# Patient Record
Sex: Female | Born: 1990 | Race: White | Hispanic: No | Marital: Single | Smoking: Never smoker
Health system: Southern US, Community
[De-identification: ages and names within clinical notes are randomized; demographics above are authoritative.]

## PROBLEM LIST (undated history)

## (undated) DIAGNOSIS — F32A Depression, unspecified: Secondary | ICD-10-CM

## (undated) DIAGNOSIS — F329 Major depressive disorder, single episode, unspecified: Secondary | ICD-10-CM

## (undated) DIAGNOSIS — F419 Anxiety disorder, unspecified: Secondary | ICD-10-CM

---

## 2014-07-25 ENCOUNTER — Ambulatory Visit: Payer: Self-pay | Admitting: Physician Assistant

## 2015-06-17 ENCOUNTER — Emergency Department
Admission: EM | Admit: 2015-06-17 | Discharge: 2015-06-17 | Disposition: A | Discharge status: Home / Self Care | Payer: Managed Care, Other (non HMO) | Attending: Emergency Medicine | Admitting: Emergency Medicine

## 2015-06-17 DIAGNOSIS — B0052 Herpesviral keratitis: Secondary | ICD-10-CM

## 2015-06-17 DIAGNOSIS — H5713 Ocular pain, bilateral: Secondary | ICD-10-CM | POA: Diagnosis present

## 2015-06-17 MED ORDER — FLUORESCEIN SODIUM 1 MG OP STRP
ORAL_STRIP | OPHTHALMIC | Status: AC
  Administered 2015-06-17: 2 via OPHTHALMIC
  Filled 2015-06-17: qty 1

## 2015-06-17 MED ORDER — TETRACAINE HCL 0.5 % OP SOLN
2.0000 [drp] | Freq: Once | OPHTHALMIC | Status: AC
  Administered 2015-06-17: 2 [drp] via OPHTHALMIC
  Filled 2015-06-17: qty 2

## 2015-06-17 MED ORDER — CIPROFLOXACIN HCL 0.3 % OP SOLN: 1.0000 [drp] | OPHTHALMIC | Status: AC

## 2015-06-17 MED ORDER — FLUORESCEIN SODIUM 1 MG OP STRP
2.0000 | ORAL_STRIP | Freq: Once | OPHTHALMIC | Status: AC
  Administered 2015-06-17: 2 via OPHTHALMIC

## 2015-06-17 NOTE — ED Notes (Addendum)
Pt reports since yesterday she has had redness to eyes and today with watery eyes and blurry vision and pain to both eyes

## 2015-06-17 NOTE — Discharge Instructions (Signed)

## 2015-06-17 NOTE — ED Provider Notes (Signed)
[facility] Emergency Department Provider Note  ____________________________________________  Time seen: Approximately 7:41 PM  I have reviewed the triage vital signs and the nursing notes.   HISTORY  Chief Complaint Eye Pain    HPI Caitlyn Grant is a 24 y.o. female resistance to the emergency department complaining of bilateral eye pain. She states that intact where yesterday it felt like "something was in my eye". She states that she removed her contacts yesterday afternoon with no residual pieces left in her eye. She states that the symptoms have continued and increased over the intervening time. She states that it is a burning sensation almost "like something is in my eyes". She denies photophobia. She states that her eyes have been excessively tearing over the last 24 hours. She has no history of same in the past. She's been wearing her glasses today versus her contacts. She denies any drainage from her eyes.   No past medical history on file.  There are no active problems to display for this patient.   No past surgical history on file.  Current Outpatient Rx  Name  Route  Sig  Dispense  Refill  . ciprofloxacin (CILOXAN) 0.3 % ophthalmic solution   Both Eyes   Place 1 drop into both eyes every 2 (two) hours. Administer 1 drop, every 2 hours, while awake, for 2 days. Then 1 drop, every 4 hours, while awake, for the next 5 days.   10 mL   0     Allergies Review of patient's allergies indicates no known allergies.  No family history on file.  Social History Social History  Substance Use Topics  . Smoking status: Not on file  . Smokeless tobacco: Not on file  . Alcohol Use: Not on file    Review of Systems Constitutional: No fever/chills Eyes: No visual changes. ENT: No sore throat. Cardiovascular: Denies chest pain. Respiratory: Denies shortness of breath. Gastrointestinal: No abdominal pain.  No nausea, no vomiting.  No  diarrhea.  No constipation. Genitourinary: Negative for dysuria. Musculoskeletal: Negative for back pain. Skin: Negative for rash. Neurological: Negative for headaches, focal weakness or numbness.  10-point ROS otherwise negative.  ____________________________________________   PHYSICAL EXAM:  VITAL SIGNS: ED Triage Vitals  Enc Vitals Group     BP 06/17/15 1902 143/90 mmHg     Pulse Rate 06/17/15 1902 72     Resp 06/17/15 1902 18     Temp 06/17/15 1902 98.2 F (36.8 C)     Temp Source 06/17/15 1902 Oral     SpO2 06/17/15 1902 98 %     Weight 06/17/15 1902 150 lb (68.04 kg)     Height 06/17/15 1902  (1.702 m)     Head Cir --      Peak Flow --      Pain Score 06/17/15 1905 9     Pain Loc --      Pain Edu? --      Excl. in GC? --     Constitutional: Alert and oriented. Well appearing and in no acute distress. Eyes: Conjunctivae are normal. PERRL. EOMI. endoscopic exam reveals red reflex, no visible foreign bodies, no AV nicking and optic nerve with no abnormality. Exam with fluorescein reveals dendritic ulcers to the 7:00 position in the right eye and 2:00 position in the left eye. No other abnormality appreciated during an inspection. Head: Atraumatic. Nose: No congestion/rhinnorhea. Mouth/Throat: Mucous membranes are moist.  Oropharynx non-erythematous. Neck: No stridor.   Cardiovascular:  Normal rate, regular rhythm. Grossly normal heart sounds.  Good peripheral circulation. Respiratory: Normal respiratory effort.  No retractions. Lungs CTAB. Gastrointestinal: Soft and nontender. No distention. No abdominal bruits. No CVA tenderness. Musculoskeletal: No lower extremity tenderness nor edema.  No joint effusions. Neurologic:  Normal speech and language. No gross focal neurologic deficits are appreciated. No gait instability. Skin:  Skin is warm, dry and intact. No rash noted. Psychiatric: Mood and affect are normal. Speech and behavior are  normal.  ____________________________________________   LABS (all labs ordered are listed, but only abnormal results are displayed)  Labs Reviewed - No data to display ____________________________________________  EKG   ____________________________________________  RADIOLOGY   ____________________________________________   PROCEDURES  Procedure(s) performed: flouroscein eye exam, see procedure note(s).   Patient's eyes were anesthetized using tetracaine 2 drops. She reports good improvement. 4 seen stain was applied bilaterally. Direct visualization through the woods lamp reveals injury ulcerations bilateral eyes. She has ulcerations to the 2:00 position in the left eye and 7:00 position in the right eye. No other abnormalities noted.  Critical Care performed: No  ____________________________________________   INITIAL IMPRESSION / ASSESSMENT AND PLAN / ED COURSE  Pertinent labs & imaging results that were available during my care of the patient were reviewed by me and considered in my medical decision making (see chart for details).  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 I informed patient of diagnosis. I'm prescribing Cipro for treatment. She verbalizes understanding of diagnosis and treatment plan. I advised the patient not to wear contacts for at least 2 days after the finish of antibiotics. Advised the patient to discard current contacts, and thoroughly cleanse contact holder. Patient verbalizes understanding of treatment plan and verbalizes compliance. Patient is to follow-up with ophthalmology should symptoms persist past treatment course. She is to return to the emergency department for any worsening of symptoms or visual acuity changes. ____________________________________________   FINAL CLINICAL IMPRESSION(S) / ED DIAGNOSES  Final diagnoses:  Dendritic ulcer      Racheal PatchesJonathan D Nikol Lemar,  PA-C 06/17/15 2145  Darien Ramusavid W Kaminski, MD 06/17/15 [phone]

## 2016-03-12 ENCOUNTER — Encounter: Payer: Self-pay | Admitting: *Deleted

## 2016-03-12 ENCOUNTER — Ambulatory Visit
Admission: EM | Admit: 2016-03-12 | Discharge: 2016-03-12 | Disposition: A | Discharge status: Home / Self Care | Payer: BLUE CROSS/BLUE SHIELD | Attending: Internal Medicine | Admitting: Internal Medicine

## 2016-03-12 DIAGNOSIS — L255 Unspecified contact dermatitis due to plants, except food: Secondary | ICD-10-CM | POA: Diagnosis not present

## 2016-03-12 HISTORY — DX: Major depressive disorder, single episode, unspecified: F32.9

## 2016-03-12 HISTORY — DX: Depression, unspecified: F32.A

## 2016-03-12 HISTORY — DX: Anxiety disorder, unspecified: F41.9

## 2016-03-12 MED ORDER — MUPIROCIN 2 % EX OINT: 1.0000 "application " | TOPICAL_OINTMENT | Freq: Three times a day (TID) | CUTANEOUS | Status: DC

## 2016-03-12 MED ORDER — TRIAMCINOLONE ACETONIDE 0.1 % EX CREA: 1.0000 "application " | TOPICAL_CREAM | Freq: Two times a day (BID) | CUTANEOUS | Status: DC

## 2016-03-12 MED ORDER — PREDNISONE 10 MG (21) PO TBPK: 10.0000 mg | ORAL_TABLET | Freq: Every day | ORAL | Status: DC

## 2016-03-12 NOTE — ED Notes (Signed)
Patient was possibly exposed to poison ivy via her inside outside cats 3 weeks ago. Rash is visible on arms and legs bilaterally.

## 2016-03-12 NOTE — Discharge Instructions (Signed)
Poison Oak °Poison oak is a rash caused by touching the leaves of the poison oak plant. You may have a rash with redness and itching. Sometimes, blisters appear and break open. Your eyes may get puffy (swollen). Poison oak often heals in 2 to 3 weeks without treatment.  °HOME CARE °· If you touch poison oak: °· Wash your skin with soap and water right away. Wash under your fingernails. Do not rub the skin very hard. °· Wash any clothes you were wearing. °· Avoid poison oak in the future. Poison oak usually has 3 leaves on a stem. °· Use medicines to help with itching as told by your doctor. Do not drive when you take this medicine. °· Keep open sores dry, clean, and covered with a bandage and medicated cream, if needed. °· Ask your doctor about medicine for children. °GET HELP RIGHT AWAY IF: °· You have open sores. °· Redness spreads beyond the area of the rash. °· There is yellowish white fluid (pus) coming from the rash. °· Pain gets worse. °· You have a temperature by mouth above 102° F (38.9° C), not controlled by medicine. °MAKE SURE YOU: °· Understand these instructions. °· Will watch your condition. °· Will get help right away if you are not doing well or get worse. °  °This information is not intended to replace advice given to you by your health care provider. Make sure you discuss any questions you have with your health care provider. °  °Document Released: 09/21/2010 Document Revised: 11/11/2011 Document Reviewed: 01/25/2015 °Elsevier Interactive Patient Education ©2016 [employer] ° °Contact Dermatitis °Dermatitis is redness, soreness, and swelling (inflammation) of the skin. Contact dermatitis is a reaction to certain substances that touch the skin. There are two types of contact dermatitis:  °· Irritant contact dermatitis. This type is caused by something that irritates your skin, such as dry hands from washing them too much. This type does not require previous exposure to the substance for a  reaction to occur. This type is more common. °· Allergic contact dermatitis. This type is caused by a substance that you are allergic to, such as a nickel allergy or poison ivy. This type only occurs if you have been exposed to the substance (allergen) before. Upon a repeat exposure, your body reacts to the substance. This type is less common. °CAUSES  °Many different substances can cause contact dermatitis. Irritant contact dermatitis is most commonly caused by exposure to:  °· Makeup.   °· Soaps.   °· Detergents.   °· Bleaches.   °· Acids.   °· Metal salts, such as nickel.   °Allergic contact dermatitis is most commonly caused by exposure to:  °· Poisonous plants.   °· Chemicals.   °· Jewelry.   °· Latex.   °· Medicines.   °· Preservatives in products, such as clothing.   °RISK FACTORS °This condition is more likely to develop in:  °· People who have jobs that expose them to irritants or allergens. °· People who have certain medical conditions, such as asthma or eczema.   °SYMPTOMS  °Symptoms of this condition may occur anywhere on your body where the irritant has touched you or is touched by you. Symptoms include: °· Dryness or flaking.   °· Redness.   °· Cracks.   °· Itching.   °· Pain or a burning feeling.   °· Blisters. °· Drainage of small amounts of blood or clear fluid from skin cracks. °With allergic contact dermatitis, there may also be swelling in areas such as the eyelids, mouth, or genitals.  °DIAGNOSIS  °This condition is   diagnosed with a medical history and physical exam. A patch skin test may be performed to help determine the cause. If the condition is related to your job, you may need to see an occupational medicine specialist. °TREATMENT °Treatment for this condition includes figuring out what caused the reaction and protecting your skin from further contact. Treatment may also include:  °· Steroid creams or ointments. Oral steroid medicines may be needed in more severe cases. °· Antibiotics or  antibacterial ointments, if a skin infection is present. °· Antihistamine lotion or an antihistamine taken by mouth to ease itching. °· A bandage (dressing). °HOME CARE INSTRUCTIONS °Skin Care  °· Moisturize your skin as needed.   °· Apply cool compresses to the affected areas. °· Try taking a bath with: °¨ Epsom salts. Follow the instructions on the packaging. You can get these at your local pharmacy or grocery store. °¨ Baking soda. Pour a small amount into the bath as directed by your health care provider. °¨ Colloidal oatmeal. Follow the instructions on the packaging. You can get this at your local pharmacy or grocery store. °· Try applying baking soda paste to your skin. Stir water into baking soda until it reaches a paste-like consistency. °· Do not scratch your skin. °· Bathe less frequently, such as every other day. °· Bathe in lukewarm water. Avoid using hot water. °Medicines  °· Take or apply over-the-counter and prescription medicines only as told by your health care provider.   °· If you were prescribed an antibiotic medicine, take or apply your antibiotic as told by your health care provider. Do not stop using the antibiotic even if your condition starts to improve. °General Instructions  °· Keep all follow-up visits as told by your health care provider. This is important. °· Avoid the substance that caused your reaction. If you do not know what caused it, keep a journal to try to track what caused it. Write down: °¨ What you eat. °¨ What cosmetic products you use. °¨ What you drink. °¨ What you wear in the affected area. This includes jewelry. °· If you were given a dressing, take care of it as told by your health care provider. This includes when to change and remove it. °SEEK MEDICAL CARE IF:  °· Your condition does not improve with treatment. °· Your condition gets worse. °· You have signs of infection such as swelling, tenderness, redness, soreness, or warmth in the affected area. °· You have a  fever. °· You have new symptoms. °SEEK IMMEDIATE MEDICAL CARE IF:  °· You have a severe headache, neck pain, or neck stiffness. °· You vomit. °· You feel very sleepy. °· You notice red streaks coming from the affected area. °· Your bone or joint underneath the affected area becomes painful after the skin has healed. °· The affected area turns darker. °· You have difficulty breathing. °  °This information is not intended to replace advice given to you by your health care provider. Make sure you discuss any questions you have with your health care provider. °  °Document Released: 08/16/2000 Document Revised: 05/10/2015 Document Reviewed: 01/04/2015 °Elsevier Interactive Patient Education ©2016 Elsevier Inc. ° °

## 2016-03-12 NOTE — ED Provider Notes (Signed)
CSN: [id number]     Arrival date & time 03/12/16  1220 History   First MD Initiated Contact with Patient 03/12/16 1412     Chief Complaint  Patient presents with  . Rash   (Consider location/radiation/quality/duration/timing/severity/associated sxs/prior Treatment) HPI   This a 25 year old female states that [place] and [place] 2 cats have been exploring the woods. Is that since that time she has noticed rash or arms and legs bilaterally. Is where she puts the cats and they were able to rub up against her. She has no rash on the closed areas. He says they are intensely itchy.   Past Medical History  Diagnosis Date  . Depression   . Anxiety    History reviewed. No pertinent past surgical history. Family History  Problem Relation Age of Onset  . Hypertension Mother   . Diabetes Father    Social History  Substance Use Topics  . Smoking status: Never Smoker   . Smokeless tobacco: Never Used  . Alcohol Use: Yes   OB History    No data available     Review of Systems  Constitutional: Positive for activity change. Negative for fever, chills and fatigue.  Skin: Positive for rash.  All other systems reviewed and are negative.   Allergies  Review of patient's allergies indicates no known allergies.  Home Medications   Prior to Admission medications   Medication Sig Start Date End Date Taking? Authorizing Provider  Venlafaxine HCl 150 MG TB24 Take 1 tablet by mouth daily.   Yes Historical Provider, MD  mupirocin ointment (BACTROBAN) 2 % Apply 1 application topically 3 (three) times daily. 03/12/16   Lutricia FeilWilliam P Cela Newcom, PA-C  predniSONE (STERAPRED UNI-PAK 21 TAB) 10 MG (21) TBPK tablet Take 1 tablet (10 mg total) by mouth daily. Take 6 tabs by mouth daily  for 2 days, then 5 tabs for 2 days, then 4 tabs for 2 days, then 3 tabs for 2 days, 2 tabs for 2 days, then 1 tab by mouth daily for 2 days 03/12/16   Lutricia FeilWilliam P Hattie Pine, PA-C  triamcinolone cream  (KENALOG) 0.1 % Apply 1 application topically 2 (two) times daily. 03/12/16   Lutricia FeilWilliam P Maleigha Colvard, PA-C   Meds Ordered and Administered this Visit  Medications - No data to display  BP 126/72 mmHg  Pulse 58  Temp(Src) 98 F (36.7 C) (Oral)  Resp 18  Ht 5\' 7"  (1.702 m)  Wt 160 lb (72.576 kg)  BMI 25.05 kg/m2  SpO2 100%  LMP 03/04/2016 No data found.   Physical Exam  Constitutional: She is oriented to person, place, and time. She appears well-developed and well-nourished. No distress.  HENT:  Head: Normocephalic and atraumatic.  Eyes: Conjunctivae are normal. Pupils are equal, round, and reactive to light.  Neck: Normal range of motion. Neck supple.  Musculoskeletal: Normal range of motion. She exhibits no edema or tenderness.  Neurological: She is alert and oriented to person, place, and time.  Skin: Skin is warm and dry. She is not diaphoretic.  Examination of the patient's arms and legs shows patches of a vesicular linear rash on an erythematous base. Primarily on her hands and arms and also on her legs on the exposed surfaces. The patient does have extensive scarring from a previous injury on her arms. And she has an abrasion where she fell down on her anterior knee that has appearance of early secondary infection.  Psychiatric: She has a normal mood and  affect. Her behavior is normal. Judgment and thought content normal.  Nursing note and vitals reviewed.   ED Course  Procedures (including critical care time)  Labs Review Labs Reviewed - No data to display  Imaging Review No results found.   Visual Acuity Review  Right Eye Distance:   Left Eye Distance:   Bilateral Distance:    Right Eye Near:   Left Eye Near:    Bilateral Near:         MDM   1. Contact dermatitis due to Genus Toxicodendron    Discharge Medication List as of 03/12/2016  2:28 PM    Plan: 1. Test/x-ray results and diagnosis reviewed with patient 2. rx as per orders; risks, benefits,  potential side effects reviewed with patient 3. Recommend supportive treatment with cool compresses or baths as necessary for comfort I recommended for the itching that she consider using Zyrtec or allegra  daytime and Benadryl at night. Cause the itching is so intense at this point I will also provide her with a small amount of steroid cream for topical control until the oral steroids start taking effect. I told her that if she contacts the cats that she should wash thoroughly with Dawn dishwashing soap. Will help dissipate the oils from the poison/poison ivy. Any further problems she should see her primary care physician or return to our clinic 4. F/u prn if symptoms worsen or don't improve      Lutricia Feil, PA-C 03/12/16 1439  Lutricia Feil, PA-C 03/12/16 1936

## 2016-09-16 ENCOUNTER — Ambulatory Visit
Admission: EM | Admit: 2016-09-16 | Discharge: 2016-09-16 | Disposition: A | Discharge status: Home / Self Care | Payer: BLUE CROSS/BLUE SHIELD | Attending: Family Medicine | Admitting: Family Medicine

## 2016-09-16 ENCOUNTER — Encounter: Payer: Self-pay | Admitting: Emergency Medicine

## 2016-09-16 DIAGNOSIS — H6501 Acute serous otitis media, right ear: Secondary | ICD-10-CM | POA: Diagnosis not present

## 2016-09-16 MED ORDER — AMOXICILLIN 875 MG PO TABS: 875.0000 mg | ORAL_TABLET | Freq: Two times a day (BID) | ORAL | 0 refills | Status: DC

## 2016-09-16 NOTE — ED Provider Notes (Signed)
[facility]    CSN: [id number] Arrival date & time: 09/16/16  0915     History   Chief Complaint Chief Complaint  Patient presents with  . Otalgia    right ear    HPI Caitlyn Grant is a 26 y.o. female.   The history is provided by the patient.  Otalgia  Associated symptoms: congestion and cough   URI  Presenting symptoms: congestion, cough and ear pain   Severity:  Moderate Onset quality:  Sudden Duration:  7 days Timing:  Constant Progression:  Worsening Chronicity:  New Relieved by:  Nothing Ineffective treatments:  OTC medications Associated symptoms: no wheezing   Risk factors: sick contacts   Risk factors: not elderly, no chronic cardiac disease, no chronic kidney disease, no chronic respiratory disease, no diabetes mellitus, no immunosuppression, no recent illness and no recent travel     Past Medical History:  Diagnosis Date  . Anxiety   . Depression     There are no active problems to display for this patient.   History reviewed. No pertinent surgical history.  OB History    No data available       Home Medications    Prior to Admission medications   Medication Sig Start Date End Date Taking? Authorizing Provider  amoxicillin (AMOXIL) 875 MG tablet Take 1 tablet (875 mg total) by mouth 2 (two) times daily. 09/16/16   Payton Mccallum, MD  Venlafaxine HCl 150 MG TB24 Take 1 tablet by mouth daily.    Historical Provider, MD    Family History Family History  Problem Relation Age of Onset  . Hypertension Mother   . Diabetes Father     Social History Social History  Substance Use Topics  . Smoking status: Never Smoker  . Smokeless tobacco: Never Used  . Alcohol use Yes     Allergies   Patient has no known allergies.   Review of Systems Review of Systems  HENT: Positive for congestion and ear pain.   Respiratory: Positive for cough. Negative for wheezing.      Physical Exam Triage Vital Signs ED Triage  Vitals  Enc Vitals Group     BP 09/16/16 0933 131/71     Pulse Rate 09/16/16 0933 92     Resp 09/16/16 0933 16     Temp 09/16/16 0933 99.1 F (37.3 C)     Temp Source 09/16/16 0933 Oral     SpO2 09/16/16 0933 100 %     Weight 09/16/16 0932 165 lb (74.8 kg)     Height 09/16/16 0932 5\' 7"  (1.702 m)     Head Circumference --      Peak Flow --      Pain Score 09/16/16 0933 7     Pain Loc --      Pain Edu? --      Excl. in GC? --    No data found.   Updated Vital Signs BP 131/71 (BP Location: Left Arm)   Pulse 92   Temp 99.1 F (37.3 C) (Oral)   Resp 16   Ht 5\' 7"  (1.702 m)   Wt 165 lb (74.8 kg)   LMP 09/06/2016 (Approximate)   SpO2 100%   BMI 25.84 kg/m   Visual Acuity Right Eye Distance:   Left Eye Distance:   Bilateral Distance:    Right Eye Near:   Left Eye Near:    Bilateral Near:     Physical Exam  Constitutional: She appears well-developed  and well-nourished. No distress.  HENT:  Head: Normocephalic and atraumatic.  Right Ear: External ear and ear canal normal. Tympanic membrane is erythematous and bulging. A middle ear effusion is present.  Left Ear: Tympanic membrane, external ear and ear canal normal.  Nose: Mucosal edema and rhinorrhea present. No nose lacerations, sinus tenderness, nasal deformity, septal deviation or nasal septal hematoma. No epistaxis.  No foreign bodies. Right sinus exhibits no maxillary sinus tenderness and no frontal sinus tenderness. Left sinus exhibits no maxillary sinus tenderness and no frontal sinus tenderness.  Mouth/Throat: Uvula is midline, oropharynx is clear and moist and mucous membranes are normal. No oropharyngeal exudate.  Eyes: Conjunctivae and EOM are normal. Pupils are equal, round, and reactive to light. Right eye exhibits no discharge. Left eye exhibits no discharge. No scleral icterus.  Neck: Normal range of motion. Neck supple. No thyromegaly present.  Cardiovascular: Normal rate, regular rhythm and normal heart  sounds.   Pulmonary/Chest: Effort normal and breath sounds normal. No respiratory distress. She has no wheezes. She has no rales.  Lymphadenopathy:    She has no cervical adenopathy.  Skin: She is not diaphoretic.  Nursing note and vitals reviewed.    UC Treatments / Results  Labs (all labs ordered are listed, but only abnormal results are displayed) Labs Reviewed - No data to display  EKG  EKG Interpretation None       Radiology No results found.  Procedures Procedures (including critical care time)  Medications Ordered in UC Medications - No data to display   Initial Impression / Assessment and Plan / UC Course  I have reviewed the triage vital signs and the nursing notes.  Pertinent labs & imaging results that were available during my care of the patient were reviewed by me and considered in my medical decision making (see chart for details).  Clinical Course       Final Clinical Impressions(s) / UC Diagnoses   Final diagnoses:  Right acute serous otitis media, recurrence not specified    New Prescriptions Discharge Medication List as of 09/16/2016 10:29 AM    START taking these medications   Details  amoxicillin (AMOXIL) 875 MG tablet Take 1 tablet (875 mg total) by mouth 2 (two) times daily., Starting Mon 09/16/2016, Normal       1. diagnosis reviewed with patient 2. rx as per orders above; reviewed possible side effects, interactions, risks and benefits  3. Follow-up prn if symptoms worsen or don't improve   Payton Mccallumrlando Newman Waren, MD 09/16/16 1049

## 2016-09-16 NOTE — ED Triage Notes (Signed)
Patient c/o right ear pain that started on Friday and that her left ear started to hurt this morning.

## 2016-12-11 ENCOUNTER — Other Ambulatory Visit: Payer: Self-pay | Admitting: Medical Oncology

## 2016-12-11 DIAGNOSIS — N6310 Unspecified lump in the right breast, unspecified quadrant: Secondary | ICD-10-CM

## 2016-12-13 ENCOUNTER — Ambulatory Visit: Payer: Self-pay | Admitting: Advanced Practice Midwife

## 2016-12-19 ENCOUNTER — Other Ambulatory Visit: Payer: BLUE CROSS/BLUE SHIELD

## 2016-12-20 ENCOUNTER — Ambulatory Visit: Payer: Self-pay | Admitting: Advanced Practice Midwife

## 2016-12-30 ENCOUNTER — Ambulatory Visit
Admission: RE | Admit: 2016-12-30 | Discharge: 2016-12-30 | Disposition: A | Discharge status: Home / Self Care | Payer: BLUE CROSS/BLUE SHIELD | Source: Ambulatory Visit | Attending: Medical Oncology | Admitting: Medical Oncology

## 2016-12-30 DIAGNOSIS — N6310 Unspecified lump in the right breast, unspecified quadrant: Secondary | ICD-10-CM

## 2016-12-30 DIAGNOSIS — N6313 Unspecified lump in the right breast, lower outer quadrant: Secondary | ICD-10-CM | POA: Diagnosis not present

## 2017-03-17 ENCOUNTER — Encounter: Payer: Self-pay | Admitting: Obstetrics & Gynecology

## 2017-03-17 ENCOUNTER — Ambulatory Visit (INDEPENDENT_AMBULATORY_CARE_PROVIDER_SITE_OTHER): Payer: BLUE CROSS/BLUE SHIELD | Admitting: Obstetrics & Gynecology

## 2017-03-17 DIAGNOSIS — T8332XA Displacement of intrauterine contraceptive device, initial encounter: Secondary | ICD-10-CM | POA: Insufficient documentation

## 2017-03-17 DIAGNOSIS — T8389XA Other specified complication of genitourinary prosthetic devices, implants and grafts, initial encounter: Secondary | ICD-10-CM | POA: Diagnosis not present

## 2017-03-17 NOTE — Progress Notes (Signed)
  History of Present Illness:  Caitlyn Grant is a [age] y.o. that had a Skyla IUD placed approximately 12 months ago. Since that time, she states that she has had feelingthat strings are longer and uncomfrtable and no pain or bleeding abnormalities; BF has no complaints  PMHx: She  has a past medical history of Anxiety and Depression. Also,  has no past surgical history on file., family history includes Diabetes in her father; Hypertension in her mother.,  reports that she has never smoked. She has never used smokeless tobacco. She reports that she drinks alcohol. She reports that she does not use drugs. Current Meds  Medication Sig  . Venlafaxine HCl 150 MG TB24 Take 1 tablet by mouth daily.  .  Also, has No Known Allergies..  Review of Systems  Constitutional: Negative for chills, fever and malaise/fatigue.  HENT: Negative for congestion, sinus pain and sore throat.   Eyes: Negative for blurred vision and pain.  Respiratory: Negative for cough and wheezing.   Cardiovascular: Negative for chest pain and leg swelling.  Gastrointestinal: Negative for abdominal pain, constipation, diarrhea, heartburn, nausea and vomiting.  Genitourinary: Negative for dysuria, frequency, hematuria and urgency.  Musculoskeletal: Negative for back pain, joint pain, myalgias and neck pain.  Skin: Negative for itching and rash.  Neurological: Negative for dizziness, tremors and weakness.  Endo/Heme/Allergies: Does not bruise/bleed easily.  Psychiatric/Behavioral: Negative for depression. The patient is not nervous/anxious and does not have insomnia.     Physical Exam:  BP 120/80   Pulse 74   Ht 5\' 7"  (1.702 m)   Wt 180 lb (81.6 kg)   LMP 03/12/2017   BMI 28.19 kg/m  Body mass index is 28.19 kg/m. Constitutional: Well nourished, well developed female in no acute distress.  Abdomen: diffusely non tender to palpation, non distended, and no masses, hernias Neuro: Grossly intact Psych:  Normal mood and  affect.    Pelvic exam:  Two IUD strings present seen coming from the cervical os. 1 cm. EGBUS, vaginal vault and cervix: within normal limits  Assessment: IUD strings present in proper location; possibly IUD shifted up a cm or less, no other cause for alarm at this time.  US if pain or bleeding occurs.  Plan: She was told to continue to use barrier contraception, in order to prevent any STIs, and to take a home pregnancy test or call us if she ever thinks she may be pregnant, and that her IUD expires in 2 years.  She was amenable to this plan and we will see her back for PAP  Annamarie MajorPaul Maris Bena, MD, Merlinda FrederickFACOG [facility], [facility] 03/17/2017  4:27 PM

## 2017-11-10 ENCOUNTER — Telehealth: Payer: Self-pay

## 2017-11-10 NOTE — Telephone Encounter (Signed)
Pt debating on appt.  Has pain during and after IC, constant pain/pressure in bladder area.  ?something wroing c bladder, feels like UTI but she is sure she doesn't have on.  [phone]  Left detailed msg for pt to be seen.

## 2017-11-14 ENCOUNTER — Ambulatory Visit (INDEPENDENT_AMBULATORY_CARE_PROVIDER_SITE_OTHER): Payer: BLUE CROSS/BLUE SHIELD | Admitting: Obstetrics and Gynecology

## 2017-11-14 ENCOUNTER — Encounter: Payer: Self-pay | Admitting: Obstetrics and Gynecology

## 2017-11-14 ENCOUNTER — Ambulatory Visit: Payer: BLUE CROSS/BLUE SHIELD | Admitting: Obstetrics and Gynecology

## 2017-11-14 ENCOUNTER — Other Ambulatory Visit: Payer: Self-pay

## 2017-11-14 VITALS — BP 120/80 | HR 80 | Ht 67.0 in | Wt 186.0 lb

## 2017-11-14 DIAGNOSIS — Z30431 Encounter for routine checking of intrauterine contraceptive device: Secondary | ICD-10-CM

## 2017-11-14 DIAGNOSIS — N941 Unspecified dyspareunia: Secondary | ICD-10-CM

## 2017-11-14 DIAGNOSIS — R300 Dysuria: Secondary | ICD-10-CM | POA: Diagnosis not present

## 2017-11-14 DIAGNOSIS — R102 Pelvic and perineal pain: Secondary | ICD-10-CM

## 2017-11-14 LAB — POCT URINALYSIS DIPSTICK
BILIRUBIN UA: NEGATIVE
Glucose, UA: NEGATIVE
KETONES UA: NEGATIVE
Leukocytes, UA: NEGATIVE
Nitrite, UA: NEGATIVE
PH UA: 6 (ref 5.0–8.0)
Protein, UA: NEGATIVE
RBC UA: NEGATIVE
SPEC GRAV UA: 1.01 (ref 1.010–1.025)

## 2017-11-14 NOTE — Patient Instructions (Signed)
I value your feedback and entrusting us with your care. If you get a [place] patient survey, I would appreciate you taking the time to let us know about your experience today. Thank you! 

## 2017-11-14 NOTE — Progress Notes (Signed)
Patient, No Pcp Per   Chief Complaint  Patient presents with  . Gynecologic Exam    painful intercourse & abdominal pain days after, dysuria    HPI:      Ms. Jerrye Seebeck is a 27 y.o. G0P0000 who LMP was Patient's last menstrual period was 10/29/2017., presents today for sharp pain during sex for the past month. She then has a dull ache for 1-2 days afterwards. She doesn't take any meds for pain. No bleeding with sex. No new sex partners. No vag sx, GI sx. She does note urin frequency, urgency and dysuria for a few days after sex that resolved. Hx of UTIs in past. No fevers, LBP. She also has mild cramping throughout the month.  Pt has Skyla placed 06/28/16. Menses are monthly, last 1-2 days with light flow, no BTB. She does have dysmen and treats with NSAIDs with relief.    Past Medical History:  Diagnosis Date  . Anxiety   . Depression     History reviewed. No pertinent surgical history.  Family History  Problem Relation Age of Onset  . Hypertension Mother   . Diabetes Father     Social History   Socioeconomic History  . Marital status: Single    Spouse name: Not on file  . Number of children: Not on file  . Years of education: Not on file  . Highest education level: Not on file  Social Needs  . Financial resource strain: Not on file  . Food insecurity - worry: Not on file  . Food insecurity - inability: Not on file  . Transportation needs - medical: Not on file  . Transportation needs - non-medical: Not on file  Occupational History  . Not on file  Tobacco Use  . Smoking status: Never Smoker  . Smokeless tobacco: Never Used  Substance and Sexual Activity  . Alcohol use: Yes  . Drug use: No  . Sexual activity: Yes    Birth control/protection: IUD  Other Topics Concern  . Not on file  Social History Narrative  . Not on file    Current Outpatient Medications on File Prior to Visit  Medication Sig Dispense Refill  . venlafaxine XR  (EFFEXOR-XR) 150 MG 24 hr capsule TAKE ONE CAPSULE BY MOUTH EVERY DAY    . amoxicillin (AMOXIL) 875 MG tablet Take 1 tablet (875 mg total) by mouth 2 (two) times daily. (Patient not taking: Reported on 03/17/2017) 20 tablet 0   No current facility-administered medications on file prior to visit.      ROS:  Review of Systems  Constitutional: Negative for fever.  Gastrointestinal: Negative for blood in stool, constipation, diarrhea, nausea and vomiting.  Genitourinary: Positive for dyspareunia, dysuria, frequency, pelvic pain and urgency. Negative for flank pain, hematuria, vaginal bleeding, vaginal discharge and vaginal pain.  Musculoskeletal: Negative for back pain.  Skin: Negative for rash.   BREAST: No symptoms   OBJECTIVE:   Vitals:  BP 120/80 (BP Location: Left Arm, Patient Position: Sitting, Cuff Size: Normal)   Pulse 80   Ht 5\' 7"  (1.702 m)   Wt 186 lb (84.4 kg)   LMP 10/29/2017   BMI 29.13 kg/m   Physical Exam  Constitutional: She is oriented to person, place, and time and well-developed, well-nourished, and in no distress. Vital signs are normal.  Abdominal: Soft. Normal appearance. There is no tenderness. There is no rebound and no guarding.  Genitourinary: Vagina normal, uterus normal, cervix normal, right adnexa normal,  left adnexa normal and vulva normal. Uterus is not enlarged. Cervix exhibits no motion tenderness and no tenderness. Right adnexum displays no mass and no tenderness. Left adnexum displays no mass and no tenderness. Vulva exhibits no erythema, no exudate, no lesion, no rash and no tenderness. Vagina exhibits no lesion.  Genitourinary Comments: IUD STRINGS IN PLACE  Neurological: She is oriented to person, place, and time.  Psychiatric: Memory, affect and judgment normal.  Vitals reviewed.  Assessment/Plan: Dyspareunia in female - Neg exam, IUD in place. Check GYN u/s--will call with results. Gon/chlam today. - Plan: US PELVIS TRANSVANGINAL NON-OB (TV  ONLY)  Pelvic pain - Plan: US PELVIS TRANSVANGINAL NON-OB (TV ONLY), Chlamydia/Gonococcus/Trichomonas, NAA  Encounter for routine checking of intrauterine contraceptive device (IUD) - Plan: US PELVIS TRANSVANGINAL NON-OB (TV ONLY)  Dysuria - Neg dip. Most likely urethritis after sex. Void before and after sex. F/u prn.  - Plan: POCT Urinalysis Dipstick    Return in 7 days (on 11/21/2017), or if symptoms worsen or fail to improve, for GYN u/s for IUD placement/pelvic pain--[facility] to call pt. (pt needs Fri appt, no appts available today)  Faryn Sieg B. Latunya Kissick, PA-C 11/14/2017 9:50 AM

## 2017-11-19 LAB — CHLAMYDIA/GONOCOCCUS/TRICHOMONAS, NAA
Chlamydia by NAA: NEGATIVE
Gonococcus by NAA: NEGATIVE
Trich vag by NAA: NEGATIVE

## 2017-11-21 ENCOUNTER — Other Ambulatory Visit: Payer: BLUE CROSS/BLUE SHIELD

## 2017-11-24 ENCOUNTER — Other Ambulatory Visit: Payer: BLUE CROSS/BLUE SHIELD

## 2017-11-25 ENCOUNTER — Ambulatory Visit (INDEPENDENT_AMBULATORY_CARE_PROVIDER_SITE_OTHER): Payer: BLUE CROSS/BLUE SHIELD

## 2017-11-25 DIAGNOSIS — N941 Unspecified dyspareunia: Secondary | ICD-10-CM | POA: Diagnosis not present

## 2017-11-25 DIAGNOSIS — Z30431 Encounter for routine checking of intrauterine contraceptive device: Secondary | ICD-10-CM

## 2017-11-25 DIAGNOSIS — R102 Pelvic and perineal pain: Secondary | ICD-10-CM

## 2017-11-27 ENCOUNTER — Telehealth: Payer: Self-pay | Admitting: Obstetrics and Gynecology

## 2017-11-27 NOTE — Telephone Encounter (Signed)
Pt aware of neg GYN u/s for pelvic pain/dyspareunia. IUD in correct location. Not as sex active currently so sx improved. Discussed IUD removal if sx persist to see if helps sx. No urin, GI sx. F/u prn.

## 2018-03-18 ENCOUNTER — Ambulatory Visit (INDEPENDENT_AMBULATORY_CARE_PROVIDER_SITE_OTHER): Payer: BLUE CROSS/BLUE SHIELD | Admitting: Obstetrics and Gynecology

## 2018-03-18 ENCOUNTER — Encounter: Payer: Self-pay | Admitting: Obstetrics and Gynecology

## 2018-03-18 VITALS — BP 124/62 | HR 96 | Ht 67.0 in | Wt 189.0 lb

## 2018-03-18 DIAGNOSIS — N898 Other specified noninflammatory disorders of vagina: Secondary | ICD-10-CM

## 2018-03-18 LAB — POCT WET PREP WITH KOH
CLUE CELLS WET PREP PER HPF POC: NEGATIVE
KOH Prep POC: NEGATIVE
TRICHOMONAS UA: NEGATIVE
Yeast Wet Prep HPF POC: NEGATIVE

## 2018-03-18 NOTE — Patient Instructions (Signed)
I value your feedback and entrusting us with your care. If you get a [place] patient survey, I would appreciate you taking the time to let us know about your experience today. Thank you! 

## 2018-03-18 NOTE — Progress Notes (Signed)
Patient, No Pcp Per   Chief Complaint  Patient presents with  . Vaginitis    HPI:      Ms. Stevan Bornshley Joyce Keys is a 27 y.o. G0P0000 who LMP was Patient's last menstrual period was 02/19/2018 (approximate)., presents today for increased vag d/c, without irritation, itch, odor. Sx today. Was treated with abx for UTI a couple wks ago. Has not used any meds to treat sx. Pt is sex active, no new partners. Neg STD testing 3/19. Still has dyspareunia with Skyla but had neg GYN u/s 3/19.    Past Medical History:  Diagnosis Date  . Anxiety   . Depression     History reviewed. No pertinent surgical history.  Family History  Problem Relation Age of Onset  . Hypertension Mother   . Diabetes Father   . Breast cancer Maternal Aunt     Social History   Socioeconomic History  . Marital status: Single    Spouse name: Not on file  . Number of children: Not on file  . Years of education: Not on file  . Highest education level: Not on file  Occupational History  . Not on file  Social Needs  . Financial resource strain: Not on file  . Food insecurity:    Worry: Not on file    Inability: Not on file  . Transportation needs:    Medical: Not on file    Non-medical: Not on file  Tobacco Use  . Smoking status: Never Smoker  . Smokeless tobacco: Never Used  Substance and Sexual Activity  . Alcohol use: Yes  . Drug use: No  . Sexual activity: Yes    Birth control/protection: IUD  Lifestyle  . Physical activity:    Days per week: Not on file    Minutes per session: Not on file  . Stress: Not on file  Relationships  . Social connections:    Talks on phone: Not on file    Gets together: Not on file    Attends religious service: Not on file    Active member of club or organization: Not on file    Attends meetings of clubs or organizations: Not on file    Relationship status: Not on file  . Intimate partner violence:    Fear of current or ex partner: Not on file   Emotionally abused: Not on file    Physically abused: Not on file    Forced sexual activity: Not on file  Other Topics Concern  . Not on file  Social History Narrative  . Not on file    Outpatient Medications Prior to Visit  Medication Sig Dispense Refill  . venlafaxine XR (EFFEXOR-XR) 150 MG 24 hr capsule TAKE ONE CAPSULE BY MOUTH EVERY DAY    . amoxicillin (AMOXIL) 875 MG tablet Take 1 tablet (875 mg total) by mouth 2 (two) times daily. (Patient not taking: Reported on 03/17/2017) 20 tablet 0   No facility-administered medications prior to visit.       ROS:  Review of Systems  Constitutional: Negative for fever.  Gastrointestinal: Negative for blood in stool, constipation, diarrhea, nausea and vomiting.  Genitourinary: Positive for vaginal discharge. Negative for dyspareunia, dysuria, flank pain, frequency, hematuria, urgency, vaginal bleeding and vaginal pain.  Musculoskeletal: Negative for back pain.  Skin: Negative for rash.   BREAST: No symptoms   OBJECTIVE:   Vitals:  BP 124/62 (BP Location: Left Arm, Patient Position: Sitting, Cuff Size: Normal)   Pulse 96  Ht 5\' 7"  (1.702 m)   Wt 189 lb (85.7 kg)   LMP 02/19/2018 (Approximate)   SpO2 97%   BMI 29.60 kg/m   Physical Exam  Constitutional: She is oriented to person, place, and time. Vital signs are normal. She appears well-developed.  Pulmonary/Chest: Effort normal.  Genitourinary: Vagina normal and uterus normal. There is no rash, tenderness or lesion on the right labia. There is no rash, tenderness or lesion on the left labia. Uterus is not enlarged and not tender. Cervix exhibits no motion tenderness. Right adnexum displays no mass and no tenderness. Left adnexum displays no mass and no tenderness. No erythema or tenderness in the vagina. No vaginal discharge found.  Musculoskeletal: Normal range of motion.  Neurological: She is alert and oriented to person, place, and time.  Psychiatric: She has a normal  mood and affect. Her behavior is normal. Thought content normal.  Vitals reviewed.   Results: Results for orders placed or performed in visit on 03/18/18 (from the past 24 hour(s))  POCT Wet Prep with KOH     Status: Normal   Collection Time: 03/18/18  4:44 PM  Result Value Ref Range   Trichomonas, UA Negative    Clue Cells Wet Prep HPF POC neg    Epithelial Wet Prep HPF POC  Few, Moderate, Many, Too numerous to count   Yeast Wet Prep HPF POC neg    Bacteria Wet Prep HPF POC  Few   RBC Wet Prep HPF POC     WBC Wet Prep HPF POC     KOH Prep POC Negative Negative     Assessment/Plan: Vaginal discharge - Neg wet prep/neg exam. Reassurance. Will send in yeast tx if vag itch/irritation develops, especially since on abx a couple wks ago. - Plan: POCT Wet Prep with KOH    Return if symptoms worsen or fail to improve.  Mickey Hebel B. Joziyah Roblero, PA-C 03/18/2018 4:45 PM
